# Patient Record
Sex: Female | Born: 1990 | Race: White | Hispanic: No | Marital: Single
Health system: Southern US, Community
[De-identification: ages and names within clinical notes are randomized; demographics above are authoritative.]

---

## 2015-01-12 ENCOUNTER — Emergency Department
Admit: 2015-01-12 | Disposition: A | Discharge status: Home / Self Care | Payer: Self-pay | Admitting: Emergency Medicine

## 2015-06-15 DIAGNOSIS — Y92410 Unspecified street and highway as the place of occurrence of the external cause: Secondary | ICD-10-CM | POA: Insufficient documentation

## 2015-06-15 DIAGNOSIS — Y9389 Activity, other specified: Secondary | ICD-10-CM | POA: Diagnosis not present

## 2015-06-15 DIAGNOSIS — S161XXA Strain of muscle, fascia and tendon at neck level, initial encounter: Secondary | ICD-10-CM | POA: Diagnosis not present

## 2015-06-15 DIAGNOSIS — Y998 Other external cause status: Secondary | ICD-10-CM | POA: Insufficient documentation

## 2015-06-15 DIAGNOSIS — T148 Other injury of unspecified body region: Secondary | ICD-10-CM | POA: Diagnosis not present

## 2015-06-15 DIAGNOSIS — S199XXA Unspecified injury of neck, initial encounter: Secondary | ICD-10-CM | POA: Diagnosis present
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
## 2015-06-16 ENCOUNTER — Emergency Department
Admission: EM | Admit: 2015-06-16 | Discharge: 2015-06-16 | Disposition: A | Discharge status: Home / Self Care | Payer: No Typology Code available for payment source | Attending: Emergency Medicine | Admitting: Emergency Medicine

## 2015-06-16 ENCOUNTER — Emergency Department: Payer: No Typology Code available for payment source

## 2015-06-16 DIAGNOSIS — S161XXA Strain of muscle, fascia and tendon at neck level, initial encounter: Secondary | ICD-10-CM

## 2015-06-16 DIAGNOSIS — T07XXXA Unspecified multiple injuries, initial encounter: Secondary | ICD-10-CM

## 2015-06-16 MED ORDER — DIAZEPAM 5 MG PO TABS: 5.0000 mg | ORAL_TABLET | Freq: Three times a day (TID) | ORAL | Status: AC | PRN

## 2015-06-16 MED ORDER — IBUPROFEN 600 MG PO TABS: 600.0000 mg | ORAL_TABLET | Freq: Three times a day (TID) | ORAL | Status: AC | PRN
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
## 2015-06-16 NOTE — ED Notes (Signed)
Pt and visitor asleep in room. resps unlabored. Call bell at side.

## 2015-06-16 NOTE — ED Notes (Signed)
Additional warm blankets provided. Call bell at side.
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
## 2015-06-16 NOTE — Discharge Instructions (Signed)
Contusion A contusion is a deep bruise. Contusions are the result of an injury that caused bleeding under the skin. The contusion may turn blue, purple, or yellow. Minor injuries will give you a painless contusion, but more severe contusions may stay painful and swollen for a few weeks.  CAUSES  A contusion is usually caused by a blow, trauma, or direct force to an area of the body. SYMPTOMS   Swelling and redness of the injured area.  Bruising of the injured area.  Tenderness and soreness of the injured area.  Pain. DIAGNOSIS  The diagnosis can be made by taking a history and physical exam. An X-ray, CT scan, or MRI may be needed to determine if there were any associated injuries, such as fractures. TREATMENT  Specific treatment will depend on what area of the body was injured. In general, the best treatment for a contusion is resting, icing, elevating, and applying cold compresses to the injured area. Over-the-counter medicines may also be recommended for pain control. Ask your caregiver what the best treatment is for your contusion. HOME CARE INSTRUCTIONS   Put ice on the injured area.  Put ice in a plastic bag.  Place a towel between your skin and the bag.  Leave the ice on for 15-20 minutes, 3-4 times a day, or as directed by your health care provider.  Only take over-the-counter or prescription medicines for pain, discomfort, or fever as directed by your caregiver. Your caregiver may recommend avoiding anti-inflammatory medicines (aspirin, ibuprofen, and naproxen) for 48 hours because these medicines may increase bruising.  Rest the injured area.  If possible, elevate the injured area to reduce swelling. SEEK IMMEDIATE MEDICAL CARE IF:   You have increased bruising or swelling.  You have pain that is getting worse.  Your swelling or pain is not relieved with medicines. MAKE SURE YOU:   Understand these instructions.  Will watch your condition.  Will get help right  away if you are not doing well or get worse. Document Released: 06/25/2005 Document Revised: 09/20/2013 Document Reviewed: 07/21/2011 [facility] Patient Information 2015 [place], [place]. This information is not intended to replace advice given to you by your health care provider. Make sure you discuss any questions you have with your health care provider.  Cervical Sprain A cervical sprain is an injury in the neck in which the strong, fibrous tissues (ligaments) that connect your neck bones stretch or tear. Cervical sprains can range from mild to severe. Severe cervical sprains can cause the neck vertebrae to be unstable. This can lead to damage of the spinal cord and can result in serious nervous system problems. The amount of time it takes for a cervical sprain to get better depends on the cause and extent of the injury. Most cervical sprains heal in 1 to 3 weeks. CAUSES  Severe cervical sprains may be caused by:   Contact sport injuries (such as from football, rugby, wrestling, hockey, auto racing, gymnastics, diving, martial arts, or boxing).   Motor vehicle collisions.   Whiplash injuries. This is an injury from a sudden forward and backward whipping movement of the head and neck.  Falls.  Mild cervical sprains may be caused by:   Being in an awkward position, such as while cradling a telephone between your ear and shoulder.   Sitting in a chair that does not offer proper support.   Working at a poorly Marketing executive station.   Looking up or down for long periods of time.  SYMPTOMS  Pain, soreness, stiffness, or a burning sensation in the front, back, or sides of the neck. This discomfort may develop immediately after the injury or slowly, 24 hours or more after the injury.   Pain or tenderness directly in the middle of the back of the neck.   Shoulder or upper back pain.   Limited ability to move the neck.   Headache.   Dizziness.   Weakness, numbness, or  tingling in the hands or arms.   Muscle spasms.   Difficulty swallowing or chewing.   Tenderness and swelling of the neck.  DIAGNOSIS  Most of the time your health care provider can diagnose a cervical sprain by taking your history and doing a physical exam. Your health care provider will ask about previous neck injuries and any known neck problems, such as arthritis in the neck. X-rays may be taken to find out if there are any other problems, such as with the bones of the neck. Other tests, such as a CT scan or MRI, may also be needed.  TREATMENT  Treatment depends on the severity of the cervical sprain. Mild sprains can be treated with rest, keeping the neck in place (immobilization), and pain medicines. Severe cervical sprains are immediately immobilized. Further treatment is done to help with pain, muscle spasms, and other symptoms and may include:  Medicines, such as pain relievers, numbing medicines, or muscle relaxants.   Physical therapy. This may involve stretching exercises, strengthening exercises, and posture training. Exercises and improved posture can help stabilize the neck, strengthen muscles, and help stop symptoms from returning.  HOME CARE INSTRUCTIONS   Put ice on the injured area.   Put ice in a plastic bag.   Place a towel between your skin and the bag.   Leave the ice on for 15-20 minutes, 3-4 times a day.   If your injury was severe, you may have been given a cervical collar to wear. A cervical collar is a two-piece collar designed to keep your neck from moving while it heals.  Do not remove the collar unless instructed by your health care provider.  If you have long hair, keep it outside of the collar.  Ask your health care provider before making any adjustments to your collar. Minor adjustments may be required over time to improve comfort and reduce pressure on your chin or on the back of your head.  Ifyou are allowed to remove the collar for  cleaning or bathing, follow your health care provider's instructions on how to do so safely.  Keep your collar clean by wiping it with mild soap and water and drying it completely. If the collar you have been given includes removable pads, remove them every 1-2 days and hand wash them with soap and water. Allow them to air dry. They should be completely dry before you wear them in the collar.  If you are allowed to remove the collar for cleaning and bathing, wash and dry the skin of your neck. Check your skin for irritation or sores. If you see any, tell your health care provider.  Do not drive while wearing the collar.   Only take over-the-counter or prescription medicines for pain, discomfort, or fever as directed by your health care provider.   Keep all follow-up appointments as directed by your health care provider.   Keep all physical therapy appointments as directed by your health care provider.   Make any needed adjustments to your workstation to promote good posture.   Avoid  positions and activities that make your symptoms worse.   Warm up and stretch before being active to help prevent problems.  SEEK MEDICAL CARE IF:   Your pain is not controlled with medicine.   You are unable to decrease your pain medicine over time as planned.   Your activity level is not improving as expected.  SEEK IMMEDIATE MEDICAL CARE IF:   You develop any bleeding.  You develop stomach upset.  You have signs of an allergic reaction to your medicine.   Your symptoms get worse.   You develop new, unexplained symptoms.   You have numbness, tingling, weakness, or paralysis in any part of your body.  MAKE SURE YOU:   Understand these instructions.  Will watch your condition.  Will get help right away if you are not doing well or get worse. Document Released: 07/13/2007 Document Revised: 09/20/2013 Document Reviewed: 03/23/2013 [facility] Patient Information 2015 [place],  [place]. This information is not intended to replace advice given to you by your health care provider. Make sure you discuss any questions you have with your health care provider.  You can use a heating pad or other method of keeping her neck warm for a few days. Do not fall asleep on any heating pad or anything else comes you can get burns however. Take Motrin up to 4 the over-the-counter pills 3 times a day which should help with inflammation of the pain. Call your doctor and get an appointment for follow-up in approximately a week. Return for any problems listed above. Return especially for worse pain numbness or weakness of the limb.
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
